# Patient Record
Sex: Female | Born: 1935 | Race: White | Hispanic: No | Marital: Married
Health system: Midwestern US, Academic
[De-identification: ages and names within clinical notes are randomized; demographics above are authoritative.]

---

## 2017-03-01 LAB — BASIC METABOLIC PANEL
Lab: 0.7
Lab: 101
Lab: 13
Lab: 132 — ABNORMAL HIGH (ref 83–110)
Lab: 133 — ABNORMAL LOW (ref 136–145)
Lab: 21 — ABNORMAL LOW (ref 23–31)
Lab: 3.6

## 2017-05-31 ENCOUNTER — Encounter: Admit: 2017-05-31 | Discharge: 2017-05-31 | Payer: MEDICARE

## 2017-05-31 DIAGNOSIS — R079 Chest pain, unspecified: ICD-10-CM

## 2017-05-31 DIAGNOSIS — I498 Other specified cardiac arrhythmias: Principal | ICD-10-CM

## 2017-05-31 DIAGNOSIS — I493 Ventricular premature depolarization: ICD-10-CM

## 2017-06-19 ENCOUNTER — Encounter: Admit: 2017-06-19 | Discharge: 2017-06-19 | Payer: MEDICARE

## 2017-06-19 DIAGNOSIS — M1990 Unspecified osteoarthritis, unspecified site: ICD-10-CM

## 2017-06-19 DIAGNOSIS — IMO0002 Overweight (BMI 25.0-29.9): ICD-10-CM

## 2017-06-19 DIAGNOSIS — N390 Urinary tract infection, site not specified: ICD-10-CM

## 2017-06-19 DIAGNOSIS — R32 Unspecified urinary incontinence: ICD-10-CM

## 2017-06-19 DIAGNOSIS — I10 Essential (primary) hypertension: Principal | ICD-10-CM

## 2017-06-19 DIAGNOSIS — H269 Unspecified cataract: ICD-10-CM

## 2017-06-19 DIAGNOSIS — M797 Fibromyalgia: ICD-10-CM

## 2017-06-19 DIAGNOSIS — R51 Headache: ICD-10-CM

## 2017-06-19 DIAGNOSIS — E785 Hyperlipidemia, unspecified: ICD-10-CM

## 2017-06-19 DIAGNOSIS — K289 Gastrojejunal ulcer, unspecified as acute or chronic, without hemorrhage or perforation: ICD-10-CM

## 2017-06-19 DIAGNOSIS — G459 Transient cerebral ischemic attack, unspecified: ICD-10-CM

## 2017-06-19 DIAGNOSIS — I739 Peripheral vascular disease, unspecified: ICD-10-CM

## 2017-06-19 DIAGNOSIS — R0600 Dyspnea, unspecified: ICD-10-CM

## 2017-06-19 DIAGNOSIS — Z923 Personal history of irradiation: ICD-10-CM

## 2017-06-19 DIAGNOSIS — Z780 Asymptomatic menopausal state: ICD-10-CM

## 2017-06-19 DIAGNOSIS — N200 Calculus of kidney: ICD-10-CM

## 2017-06-19 DIAGNOSIS — K529 Noninfective gastroenteritis and colitis, unspecified: ICD-10-CM

## 2017-07-31 ENCOUNTER — Ambulatory Visit: Admit: 2017-07-31 | Discharge: 2017-08-01 | Payer: MEDICARE

## 2017-07-31 ENCOUNTER — Encounter: Admit: 2017-07-31 | Discharge: 2017-07-31 | Payer: MEDICARE

## 2017-07-31 DIAGNOSIS — G459 Transient cerebral ischemic attack, unspecified: ICD-10-CM

## 2017-07-31 DIAGNOSIS — R32 Unspecified urinary incontinence: ICD-10-CM

## 2017-07-31 DIAGNOSIS — I10 Essential (primary) hypertension: Principal | ICD-10-CM

## 2017-07-31 DIAGNOSIS — R0600 Dyspnea, unspecified: ICD-10-CM

## 2017-07-31 DIAGNOSIS — Z780 Asymptomatic menopausal state: ICD-10-CM

## 2017-07-31 DIAGNOSIS — M797 Fibromyalgia: ICD-10-CM

## 2017-07-31 DIAGNOSIS — M1990 Unspecified osteoarthritis, unspecified site: ICD-10-CM

## 2017-07-31 DIAGNOSIS — K529 Noninfective gastroenteritis and colitis, unspecified: ICD-10-CM

## 2017-07-31 DIAGNOSIS — R5382 Chronic fatigue, unspecified: ICD-10-CM

## 2017-07-31 DIAGNOSIS — N390 Urinary tract infection, site not specified: ICD-10-CM

## 2017-07-31 DIAGNOSIS — E785 Hyperlipidemia, unspecified: ICD-10-CM

## 2017-07-31 DIAGNOSIS — R079 Chest pain, unspecified: Principal | ICD-10-CM

## 2017-07-31 DIAGNOSIS — R0789 Other chest pain: ICD-10-CM

## 2017-07-31 DIAGNOSIS — IMO0002 Overweight (BMI 25.0-29.9): ICD-10-CM

## 2017-07-31 DIAGNOSIS — I159 Secondary hypertension, unspecified: ICD-10-CM

## 2017-07-31 DIAGNOSIS — H269 Unspecified cataract: ICD-10-CM

## 2017-07-31 DIAGNOSIS — Z87891 Personal history of nicotine dependence: ICD-10-CM

## 2017-07-31 DIAGNOSIS — Z923 Personal history of irradiation: ICD-10-CM

## 2017-07-31 DIAGNOSIS — R0602 Shortness of breath: ICD-10-CM

## 2017-07-31 DIAGNOSIS — R51 Headache: ICD-10-CM

## 2017-07-31 DIAGNOSIS — K289 Gastrojejunal ulcer, unspecified as acute or chronic, without hemorrhage or perforation: ICD-10-CM

## 2017-07-31 DIAGNOSIS — Z8673 Personal history of transient ischemic attack (TIA), and cerebral infarction without residual deficits: ICD-10-CM

## 2017-07-31 DIAGNOSIS — I739 Peripheral vascular disease, unspecified: ICD-10-CM

## 2017-07-31 DIAGNOSIS — N200 Calculus of kidney: ICD-10-CM

## 2017-07-31 MED ORDER — DILTIAZEM HCL 120 MG PO CDER
120 mg | ORAL_CAPSULE | Freq: Every day | ORAL | 3 refills | 90.00000 days | Status: AC
Start: 2017-07-31 — End: 2017-09-13

## 2017-07-31 NOTE — Progress Notes
Date of Service: 07/31/2017    Michelle Woods is a 81 y.o. female.       HPI     Patient remotely is a 81 year old white female with a history of hypertension, chronic fatigue, fibromyalgia syndrome, history of TIAs and hyperlipidemia.  She was last seen in the office by me December 2016.    Previously patient was evaluated with an myocardial perfusion imaging study in May 2013, the tomographic pattern was negative for ischemia.  An echocardiogram was also performed on Feb 22, 2012, it demonstrated normal left ventricular systolic function, moderate concentric LVH, there are no significant valvular abnormalities and the pulmonary artery pressure was normal.    Patient is here today and she reports profound shortness of breath that is exacerbated by physical activity and in particular going up stairs.  She has also been experiencing some left-sided chest pain, this occurs with and without physical activity.  Patient has not had any nausea, vomiting, diaphoresis, presyncope or syncope.    She does have a history of tobacco use, patient used to smoke a pack of cigarettes a day.  She quit tobacco products approximately 10 years ago.    She also underwent a right knee replacement in June 2018, she reports that she had she had a difficult time (waking up from anesthesia).    Patient has fibromyalgia and osteoarthritis.  Her medication list does include several neurotropic drugs that are represented by gabapentin, hydrocodone/acetaminophen, hydromorphone, Keppra (apparently she does have a history of seizure disorder), and she also takes methocarbamol.         Vitals:    07/31/17 1032 07/31/17 1034   BP: 148/78 152/82   Pulse:  71   Weight: 73.5 kg (162 lb)    Height: 1.651 m (5' 5)      Body mass index is 26.96 kg/m???.     Past Medical History  Patient Active Problem List    Diagnosis Date Noted   ??? Chronic fatigue 08/31/2015   ??? UTI (lower urinary tract infection) 07/09/2013     - constipation, infrequent estrace use - did not complete PFRT due to distance  - most bothered by nighttime frequency and urine malodor     ??? Atypical chest pain 02/08/2012   ??? Fibromyalgia syndrome 02/08/2012   ??? Hypertension 02/08/2012   ??? Hyperlipidemia LDL goal < 100 02/08/2012   ??? History of TIAs 02/07/2012     1.  No residual           Review of Systems   Constitution: Negative.   HENT: Negative.    Eyes: Negative.    Cardiovascular: Positive for chest pain and dyspnea on exertion.   Respiratory: Negative.    Endocrine: Negative.    Hematologic/Lymphatic: Bruises/bleeds easily.   Skin: Negative.    Musculoskeletal: Positive for arthritis.   Gastrointestinal: Negative.    Genitourinary: Negative.    Neurological: Negative.    Psychiatric/Behavioral: Negative.    Allergic/Immunologic: Negative.        Physical Exam  General Appearance: normal in appearance  Skin: warm, moist, no ulcers or xanthomas  Eyes: conjunctivae and lids normal, pupils are equal and round  Lips & Oral Mucosa: no pallor or cyanosis  Neck Veins: neck veins are flat, neck veins are not distended  Chest Inspection: chest is normal in appearance  Respiratory Effort: breathing comfortably, no respiratory distress  Auscultation/Percussion: lungs clear to auscultation, no rales or rhonchi, no wheezing  Cardiac Rhythm: regular rhythm and normal  rate  Cardiac Auscultation: S1, S2 normal, no rub, no gallop  Murmurs: no murmur  Carotid Arteries: normal carotid upstroke bilaterally, no bruit  Lower Extremity Edema: no lower extremity edema  Abdominal Exam: soft, non-tender, no masses, bowel sounds normal  Liver & Spleen: no organomegaly  Language and Memory: patient responsive and seems to comprehend information  Neurologic Exam: neurological assessment grossly intact      Cardiovascular Studies  Twelve-lead EKG demonstrates normal sinus rhythm, no ST segment T wave changes    Problems Addressed Today  Encounter Diagnoses   Name Primary?   ??? Chest pain, unspecified type Yes ??? Secondary hypertension    ??? Fibromyalgia syndrome    ??? Atypical chest pain    ??? Dyspnea, unspecified type    ??? SOB (shortness of breath)    ??? History of TIAs    ??? Chronic fatigue    ??? History of tobacco abuse        Assessment and Plan     In summary: This is a 81 year old white female with a history of poorly controlled hypertension, profound shortness of breath exacerbated by physical activity (this could be secondary to diastolic dysfunction in the setting of poorly controlled hypertension), left-sided chest pain, history of tobacco use, osteoarthritis and fibromyalgia, patient is on multiple neurotropic medications, chronic fatigue and history of tobacco use.    In the light of the above, I recommend the following:  1.  Discontinue metoprolol  2.  Start diltiazem CD 120 mg p.o. daily (the beta-blocker was changed to a non-dihydropyridine calcium channel blocker in the light of the fact that patient has a history of heavy tobacco use in the past, she could have underlying undiagnosed lung disease and it is possible that the beta-blocker exacerbate her symptoms).  3.  Patient will be further evaluated with a regadenoson MPI and a 2D echo Doppler study  4.  Follow-up office visit in 4-6 weeks to discuss the results of the test and also evaluate her blood pressure.         Current Medications (including today's revisions)  ??? ASCORBATE CALCIUM (VITAMIN C PO) Take 1,000 mg by mouth daily.   ??? aspirin EC 81 mg tablet Take 1 Tab by mouth daily. Take with food.   ??? CALCIUM PO Take 600 mg by mouth daily.   ??? citalopram (CELEXA) 20 mg tablet Take 20 mg by mouth daily.     ??? dicyclomine (BENTYL) 10 mg capsule Take 10 mg by mouth three times daily.   ??? esomeprazole DR(+) (NEXIUM) 40 mg capsule Take 40 mg by mouth daily.   ??? fexofenadine(+) (ALLEGRA) 180 mg tablet Take 180 mg by mouth daily.   ??? gabapentin (NEURONTIN) 300 mg capsule Take 600 mg by mouth three times daily. ??? GLUCOSAMINE/CHONDROITIN SULF A (GLUCOSAMINE-CHONDROITIN PO) Take  by mouth daily.     ??? HYDROcodone/acetaminophen(+) (NORCO) 10/325 mg tablet Take 1 Tab by mouth every 6 hours as needed for Pain   ??? HYDROmorphone(+) (EXALGO ER) 8 mg tablet Take 8 mg by mouth every 24 hours   ??? levETIRAcetam (KEPPRA) 500 mg tablet Take 500 mg by mouth daily.   ??? methocarbamol (ROBAXIN) 500 mg tablet Take 1,000 mg by mouth three times daily.   ??? metoprolol XL (TOPROL XL) 25 mg extended release tablet Take 25 mg by mouth daily.   ??? montelukast (SINGULAIR) 10 mg tablet Take 10 mg by mouth at bedtime daily.   ??? MULTIVIT &MINERALS/FERROUS FUM (MULTI VITAMIN PO)  Take  by mouth daily.     ??? traZODone (DESYREL) 150 mg tablet Take 150 mg by mouth at bedtime daily.     ??? trimethoprim (TRIMPEX) 100 mg tablet Take 100 mg by mouth twice daily.

## 2017-08-03 ENCOUNTER — Encounter: Admit: 2017-08-03 | Discharge: 2017-08-03 | Payer: MEDICARE

## 2017-08-03 NOTE — Telephone Encounter
Pt called and states that she would like to have her stress test rescheduled in [place].  Pt was originally scheduled for echo and stress test at [facility].  Rescheduled her appointments for 11/19 at 9:00 for echo, and 10:30 for stress test.  Pt confirmed appointment times.  Discussed stress test instructions, pt verbalized understanding.  Called and cancelled scheduled testing at [facility].

## 2017-08-24 ENCOUNTER — Ambulatory Visit: Admit: 2017-08-24 | Discharge: 2017-08-25 | Payer: MEDICARE

## 2017-08-24 DIAGNOSIS — R0789 Other chest pain: ICD-10-CM

## 2017-08-24 DIAGNOSIS — R0600 Dyspnea, unspecified: ICD-10-CM

## 2017-08-24 DIAGNOSIS — R079 Chest pain, unspecified: Principal | ICD-10-CM

## 2017-08-24 DIAGNOSIS — M797 Fibromyalgia: ICD-10-CM

## 2017-08-24 DIAGNOSIS — I159 Secondary hypertension, unspecified: ICD-10-CM

## 2017-08-24 MED ORDER — PERFLUTREN LIPID MICROSPHERES 1.1 MG/ML IV SUSP
1-20 mL | Freq: Once | INTRAVENOUS | 0 refills | Status: CP
Start: 2017-08-24 — End: ?

## 2017-08-24 NOTE — Progress Notes
Peripheral IV Insertion Note:  Patient Side: right  Line Orientation:Forearm  IV Catheter Size: 22G  Number of Attempts:1.  IV capped and flushed with Normal Saline.  IV site without redness, swelling, or pain.  New dressing placed.    After procedure IV cannula removed intact and hemostasis achieved.

## 2017-08-27 ENCOUNTER — Encounter: Admit: 2017-08-27 | Discharge: 2017-08-27 | Payer: MEDICARE

## 2017-08-27 ENCOUNTER — Ambulatory Visit: Admit: 2017-08-27 | Discharge: 2017-08-27 | Payer: MEDICARE

## 2017-08-27 DIAGNOSIS — Z923 Personal history of irradiation: ICD-10-CM

## 2017-08-27 DIAGNOSIS — I739 Peripheral vascular disease, unspecified: ICD-10-CM

## 2017-08-27 DIAGNOSIS — M797 Fibromyalgia: ICD-10-CM

## 2017-08-27 DIAGNOSIS — Z780 Asymptomatic menopausal state: ICD-10-CM

## 2017-08-27 DIAGNOSIS — R0600 Dyspnea, unspecified: ICD-10-CM

## 2017-08-27 DIAGNOSIS — F329 Major depressive disorder, single episode, unspecified: ICD-10-CM

## 2017-08-27 DIAGNOSIS — G459 Transient cerebral ischemic attack, unspecified: ICD-10-CM

## 2017-08-27 DIAGNOSIS — K529 Noninfective gastroenteritis and colitis, unspecified: ICD-10-CM

## 2017-08-27 DIAGNOSIS — M1990 Unspecified osteoarthritis, unspecified site: ICD-10-CM

## 2017-08-27 DIAGNOSIS — K289 Gastrojejunal ulcer, unspecified as acute or chronic, without hemorrhage or perforation: ICD-10-CM

## 2017-08-27 DIAGNOSIS — N200 Calculus of kidney: ICD-10-CM

## 2017-08-27 DIAGNOSIS — R32 Unspecified urinary incontinence: ICD-10-CM

## 2017-08-27 DIAGNOSIS — Q019 Encephalocele, unspecified: Principal | ICD-10-CM

## 2017-08-27 DIAGNOSIS — H269 Unspecified cataract: ICD-10-CM

## 2017-08-27 DIAGNOSIS — I10 Essential (primary) hypertension: Principal | ICD-10-CM

## 2017-08-27 DIAGNOSIS — IMO0002 Overweight (BMI 25.0-29.9): ICD-10-CM

## 2017-08-27 DIAGNOSIS — R51 Headache: ICD-10-CM

## 2017-08-27 DIAGNOSIS — E785 Hyperlipidemia, unspecified: ICD-10-CM

## 2017-08-27 DIAGNOSIS — N390 Urinary tract infection, site not specified: ICD-10-CM

## 2017-08-27 NOTE — Telephone Encounter
Called and discussed results with patient.  No questions at this time.  Pt will callback with any questions, concerns or problems.

## 2017-08-27 NOTE — Telephone Encounter
-----   Message from Samantha Crimes, MD sent at 08/26/2017  5:59 PM CST -----  Dear nurses,    Please let this patient know that her echocardiogram is overall unremarkable.    Thank you      ----- Message -----  From: Shan Levans, MD  Sent: 08/24/2017   1:40 PM  To: Samantha Crimes, MD

## 2017-08-27 NOTE — Progress Notes
Date of Service: 08/27/2017    Subjective:             Michelle Woods is a 81 y.o. female.    History of Present Illness  PCP: Dr. Ozzie Hoyle  ENT - Dr. 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  She has had headaches for a long period of time.  She describes the headaches as occurring about 2-3 times a week.  She describes them at various locations in her head.  It gets better with rest and sleeping as well as Aleve.  There is nothing that significantly causes the headaches.  The frequency does vary.  She denies any neurologic symptoms associated with it.  She denies any clear drainage from her nose or salty taste in the back of her throat.  She denies any otorrhea.    The patient does have a history of what sounds like simple partial seizures.  The patient follows with Dr. Onalee Hua Ewing for this.  She has been put on the sonogram and Keppra for about 3-4 years.  She has not had any seizures since being put on these medications.    The patient has a history of cerebral AVM.  She underwent stereotactic radiosurgery by Dr. Shearon Stalls in the 90s.  She tells me that it has been obliterated by this.  I do not have any imaging to confirm this.     Review of Systems   Eyes: Positive for visual disturbance.   Respiratory: Positive for shortness of breath.    Gastrointestinal:        Heartburn     Genitourinary: Positive for frequency.        Leaking urine, nocturia     Musculoskeletal: Positive for back pain, myalgias and neck pain.   Allergic/Immunologic:        Frequent infections     Neurological: Positive for headaches.   Psychiatric/Behavioral: The patient is nervous/anxious.    All other systems reviewed and are negative.    Past Medical History:   Diagnosis Date   ??? Arthritis    ??? Cataract    ??? Chest pain    ??? Claudication [facility])    ??? Colitis    ??? Depression    ??? Dyspnea    ??? Fibromyalgia ??? Generalized headaches    ??? History of radiation therapy    ??? HTN (hypertension)    ??? Hyperlipemia    ??? Incontinence    ??? Kidney stones    ??? Overweight (BMI 25.0-29.9)    ??? Postmenopausal    ??? TIA (transient ischemic attack)    ??? Ulcer of the stomach and intestine    ??? Urinary tract infection      Past Surgical History:   Procedure Laterality Date   ??? 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 CHOLECYSTECTOMY     ??? ELECTROCARDIOGRAM     ??? HX BACK SURGERY     ??? HYSTERECTOMY     ??? KIDNEY STONE SURGERY     ??? MYOCARDIAL PERFUSION IMG STUDY       Family History   Problem Relation Age of Onset   ??? Stroke Mother      Social History     Social History   ??? Marital status: Married     Spouse name: N/A   ???  Number of children: N/A   ??? Years of education: N/A     Social History Main Topics   ??? Smoking status: Former Smoker     Packs/day: 1.00     Years: 50.00     Types: Cigarettes   ??? Smokeless tobacco: Never Used   ??? Alcohol use No   ??? Drug use: No   ??? Sexual activity: No     Other Topics Concern   ??? Not on file     Social History Narrative   ??? No narrative on file       Objective:         ??? ASCORBATE CALCIUM (VITAMIN C PO) Take 1,000 mg by mouth daily.   ??? aspirin EC 81 mg tablet Take 1 Tab by mouth daily. Take with food.   ??? CALCIUM PO Take 600 mg by mouth daily.   ??? cetirizine (ZYRTEC) 10 mg tablet Take 10 mg by mouth every morning.   ??? citalopram (CELEXA) 20 mg tablet Take 20 mg by mouth daily.     ??? dicyclomine (BENTYL) 10 mg capsule Take 10 mg by mouth three times daily.   ??? diltiazem XR (DILACOR XR) 120 mg capsule Take one capsule by mouth daily.   ??? esomeprazole DR(+) (NEXIUM) 40 mg capsule Take 40 mg by mouth daily.   ??? gabapentin (NEURONTIN) 300 mg capsule Take 600 mg by mouth three times daily.     ??? GLUCOSAMINE/CHONDROITIN SULF A (GLUCOSAMINE-CHONDROITIN PO) Take  by mouth daily.     ??? HYDROcodone/acetaminophen(+) (NORCO) 10/325 mg tablet Take 1 Tab by mouth every 6 hours as needed for Pain   ??? HYDROmorphone(+) (EXALGO ER) 8 mg tablet Take 8 mg by mouth every 24 hours   ??? levETIRAcetam (KEPPRA) 500 mg tablet Take 500 mg by mouth daily.   ??? methocarbamol (ROBAXIN) 500 mg tablet Take 1,000 mg by mouth three times daily.   ??? metoprolol XL (TOPROL XL) 25 mg extended release tablet Take 25 mg by mouth daily.   ??? mometasone furoate (NASONEX NA) Apply 1 spray into nose as directed daily as needed.   ??? montelukast (SINGULAIR) 10 mg tablet Take 10 mg by mouth at bedtime daily.   ??? MULTIVIT &MINERALS/FERROUS FUM (MULTI VITAMIN PO) Take  by mouth daily.     ??? senna/docusate (SENOKOT-S) 8.6/50 mg tablet Take 1 tablet by mouth twice daily.   ??? traMADol (ULTRAM) 50 mg tablet Take 50 mg by mouth every 6 hours as needed for Pain.   ??? traZODone (DESYREL) 150 mg tablet Take 150 mg by mouth at bedtime daily.     ??? trimethoprim (TRIMPEX) 100 mg tablet Take 100 mg by mouth twice daily.   ??? zonisamide (ZONEGRAN) 100 mg capsule Take 100 mg by mouth at bedtime daily.     Vitals:    08/27/17 1213   BP: 136/71   Pulse: 80   Weight: 72.8 kg (160 lb 9.6 oz)   Height: 165.1 cm (65)     Body mass index is 26.73 kg/m???.     Physical Exam  Constitutional: Well-developed, well nourished  HENT: Normocephalic, atraumatic, Oropharynx clear and moist  Eyes: Pupils equal round and reactive to light, Extraocular muscles normal  Cardiovascular: Heart sounds normal  Pulmonary: Effort normal  Abdominal: No tenderness  Musculoskeletal: ROM normal  Skin: Warm, dry  Neurological: A&Ox3, speech normal  CN: Facial sensation equal, Smile equal, eye closure equal, hearing present bilaterally, palate rises  bilaterally, shoulder shrug equal,  tongue protrusion midline  5/5 strength x 4 extremities  Soft touch present x 4 extremities    MRI of the brain reviewed which shows evidence of a left suboccipital bony mass.  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 CT head reviewed which shows the bony lesion as well as the dehiscence in the middle cranial fossa going into the sphenoid sinus.    I have compared her MRI that was done recently to an old one on the [facility] PACS system from 2012.  Both of these lesions were evident at that time.     Assessment and Plan:  81 year old female who presents with headaches.  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           I have explained to the patient and her daughter that these lesions that we have seen on her imaging have been present since at least 2012.  I do not believe that these are causing her headaches.  She does not have any signs and symptoms to suggest spinal fluid leak.  At this time, I do not think that we need to do anything aggressive from a neurosurgical standpoint.  We discussed the possibility of watching this with serial imaging, potentially in 5 years.  The patient made it clear to me that she really does not want any thing done.  Therefore, she has declined follow-up imaging, which I think is reasonable at her age of 70.  If she develops any signs and symptoms of CSF leak, or uncontrolled seizures, or other neurologic symptoms, she will let us know and we can certainly address it at that time.    Happy to see her back on an as-needed basis.    Thank you for allowing me to take care of Michelle Woods. If you have any questions or concerns, do not hesitate to contact me at [phone].     Theotis Barrio MD

## 2017-08-29 ENCOUNTER — Ambulatory Visit: Admit: 2017-08-29 | Discharge: 2017-08-30 | Payer: MEDICARE

## 2017-08-29 MED ORDER — NITROGLYCERIN 0.4 MG SL SUBL
.4 mg | SUBLINGUAL | 0 refills | Status: AC | PRN
Start: 2017-08-29 — End: ?

## 2017-08-29 MED ORDER — ALBUTEROL SULFATE 90 MCG/ACTUATION IN HFAA
2 | RESPIRATORY_TRACT | 0 refills | Status: DC | PRN
Start: 2017-08-29 — End: 2017-09-03

## 2017-08-29 MED ORDER — REGADENOSON 0.4 MG/5 ML IV SYRG
.4 mg | Freq: Once | INTRAVENOUS | 0 refills | Status: CP
Start: 2017-08-29 — End: ?

## 2017-08-29 MED ORDER — SODIUM CHLORIDE 0.9 % IV SOLP
250 mL | INTRAVENOUS | 0 refills | Status: AC | PRN
Start: 2017-08-29 — End: ?

## 2017-08-29 MED ORDER — AMINOPHYLLINE 500 MG/20 ML IV SOLN
50 mg | INTRAVENOUS | 0 refills | Status: AC | PRN
Start: 2017-08-29 — End: ?

## 2017-08-29 NOTE — Progress Notes
Peripheral IV Insertion Note:  Patient Side: left  Line Orientation:Forearm  IV Catheter Size: 22G  Number of Attempts:1.  IV capped and flushed with Normal Saline.  IV site without redness, swelling, or pain.  New dressing placed.    After procedure IV cannula removed intact and hemostasis achieved.

## 2017-09-03 ENCOUNTER — Encounter: Admit: 2017-09-03 | Discharge: 2017-09-03 | Payer: MEDICARE

## 2017-09-13 ENCOUNTER — Encounter: Admit: 2017-09-13 | Discharge: 2017-09-13 | Payer: MEDICARE

## 2017-09-13 ENCOUNTER — Ambulatory Visit: Admit: 2017-09-13 | Discharge: 2017-09-14 | Payer: MEDICARE

## 2017-09-13 DIAGNOSIS — N200 Calculus of kidney: ICD-10-CM

## 2017-09-13 DIAGNOSIS — M797 Fibromyalgia: ICD-10-CM

## 2017-09-13 DIAGNOSIS — N390 Urinary tract infection, site not specified: ICD-10-CM

## 2017-09-13 DIAGNOSIS — R0602 Shortness of breath: ICD-10-CM

## 2017-09-13 DIAGNOSIS — Z8673 Personal history of transient ischemic attack (TIA), and cerebral infarction without residual deficits: ICD-10-CM

## 2017-09-13 DIAGNOSIS — E781 Pure hyperglyceridemia: ICD-10-CM

## 2017-09-13 DIAGNOSIS — K529 Noninfective gastroenteritis and colitis, unspecified: ICD-10-CM

## 2017-09-13 DIAGNOSIS — M1990 Unspecified osteoarthritis, unspecified site: ICD-10-CM

## 2017-09-13 DIAGNOSIS — H269 Unspecified cataract: ICD-10-CM

## 2017-09-13 DIAGNOSIS — R51 Headache: ICD-10-CM

## 2017-09-13 DIAGNOSIS — Z923 Personal history of irradiation: ICD-10-CM

## 2017-09-13 DIAGNOSIS — R0989 Other specified symptoms and signs involving the circulatory and respiratory systems: Principal | ICD-10-CM

## 2017-09-13 DIAGNOSIS — R0789 Other chest pain: ICD-10-CM

## 2017-09-13 DIAGNOSIS — E785 Hyperlipidemia, unspecified: ICD-10-CM

## 2017-09-13 DIAGNOSIS — K289 Gastrojejunal ulcer, unspecified as acute or chronic, without hemorrhage or perforation: ICD-10-CM

## 2017-09-13 DIAGNOSIS — R0600 Dyspnea, unspecified: ICD-10-CM

## 2017-09-13 DIAGNOSIS — IMO0002 Overweight (BMI 25.0-29.9): ICD-10-CM

## 2017-09-13 DIAGNOSIS — I739 Peripheral vascular disease, unspecified: ICD-10-CM

## 2017-09-13 DIAGNOSIS — R32 Unspecified urinary incontinence: ICD-10-CM

## 2017-09-13 DIAGNOSIS — Z87891 Personal history of nicotine dependence: ICD-10-CM

## 2017-09-13 DIAGNOSIS — G459 Transient cerebral ischemic attack, unspecified: ICD-10-CM

## 2017-09-13 DIAGNOSIS — I10 Essential (primary) hypertension: Principal | ICD-10-CM

## 2017-09-13 DIAGNOSIS — F329 Major depressive disorder, single episode, unspecified: ICD-10-CM

## 2017-09-13 DIAGNOSIS — Z780 Asymptomatic menopausal state: ICD-10-CM

## 2017-09-13 MED ORDER — LISINOPRIL 10 MG PO TAB
10 mg | ORAL_TABLET | Freq: Two times a day (BID) | ORAL | 4 refills | Status: AC
Start: 2017-09-13 — End: 2018-05-09

## 2017-09-13 NOTE — Progress Notes
Carotid duplex scheduled for Jan 2 at 9:30 am at the [facility].  Order faxed and pt aware of date, time and location.  Flag forwarded to watch for results.

## 2017-09-13 NOTE — Progress Notes
Date of Service: 09/13/2017    Michelle Woods is a 81 y.o. female.       HPI     Mrs. Michelle Woods is a 81 year old white female, she was seen by me in the office on 07/31/2017.  She presented with symptoms of shortness of breath, poorly controlled hypertension, left-sided chest pain.  This patient also has a history of osteoarthritis and fibromyalgia.    She was evaluated with a 2D echo Doppler study on 08/24/2017, the left ventricular systolic function was normal, she was found to have mild concentric LVH, there are no significant valvular appendectomy mitis and the pulmonary artery pressure was normal.  In addition, a nuclear stress test dated 08/29/2017 did not demonstrate ischemia and ejection fraction was normal at 64%.    At the previous office visit, I intended to discontinue the metoprolol and I prescribed diltiazem.  The rationale for this change was patient's previous history of tobacco use and a concern that a beta-blocker could enhance the symptoms of dyspnea.  However, for reasons that are not completely clear and patient continues to take this changes never took place the metoprolol.    Today in the office the blood pressure was 166/84 mmHg in the left arm and 176/86 mmHg in the right arm with a heart rate of 68 bpm.    Also, a right carotid bruit was heard today on the physical exam.         Vitals:    09/13/17 1449 09/13/17 1456   BP: 166/84 176/86   Pulse: 68    Weight: 73.4 kg (161 lb 12.8 oz)    Height: 1.651 m (5' 5)      Body mass index is 26.92 kg/m???.     Past Medical History  Patient Active Problem List    Diagnosis Date Noted   ??? 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 SOB (shortness of breath) 07/31/2017   ??? History of tobacco abuse 07/31/2017   ??? Chronic fatigue 08/31/2015   ??? UTI (lower urinary tract infection) 07/09/2013     - constipation, infrequent estrace use  - did not complete PFRT due to distance  - most bothered by nighttime frequency and urine malodor ??? Atypical chest pain 02/08/2012   ??? Fibromyalgia syndrome 02/08/2012   ??? Essential hypertension 02/08/2012   ??? Hyperlipidemia LDL goal < 100 02/08/2012   ??? History of TIAs 02/07/2012     1.  No residual           Review of Systems   Constitution: Negative.   HENT: Negative.    Eyes: Negative.    Cardiovascular: Positive for chest pain and dyspnea on exertion.   Respiratory: Negative.    Endocrine: Negative.    Hematologic/Lymphatic: Negative.    Skin: Negative.    Musculoskeletal: Positive for arthritis and back pain.   Gastrointestinal: Negative.    Genitourinary: Negative.    Neurological: Negative.    Psychiatric/Behavioral: Negative.    Allergic/Immunologic: Negative.        Physical Exam  General Appearance: normal in appearance  Skin: warm, moist, no ulcers or xanthomas  Eyes: conjunctivae and lids normal, pupils are equal and round  Lips & Oral Mucosa: no pallor or cyanosis  Neck Veins: neck veins are flat, neck veins are not distended  Chest Inspection: chest is normal in appearance  Respiratory Effort: breathing comfortably, no respiratory distress  Auscultation/Percussion: lungs clear to auscultation, no rales or rhonchi, no wheezing  Cardiac Rhythm: regular rhythm and  normal rate  Cardiac Auscultation: S1, S2 normal, no rub, no gallop  Murmurs: no murmur  Carotid Arteries: normal carotid upstroke bilaterally, R bruit  Lower Extremity Edema: no lower extremity edema  Abdominal Exam: soft, non-tender, no masses, bowel sounds normal  Liver & Spleen: no organomegaly  Language and Memory: patient responsive and seems to comprehend information  Neurologic Exam: neurological assessment grossly intact      Cardiovascular Studies      Problems Addressed Today  Encounter Diagnoses   Name Primary?   ??? Bruit Yes   ??? Essential hypertension    ??? History of TIAs    ??? Pure hyperglyceridemia    ??? Atypical chest pain    ??? Fibromyalgia syndrome    ??? SOB (shortness of breath)    ??? History of tobacco abuse ??? Right carotid bruit        Assessment and Plan     In summary: This is a 81 year old white female with poorly controlled hypertension, unremarkable recent workup (this consisted of a 2D echo Doppler study and myocardial perfusion imaging study), a newly her right carotid bruit today on the physical exam.  Next    In the light of the above, I recommend the following    1. I did discontinue the diltiazem that was prescribed at the last office visit and was serial present on patient's medication list  2. I asked her to continue take metoprolol  3. Initiate lisinopril 10 mg p.o. twice daily (patient was on this medication before and according to her report she was instructed to discontinue it).  4. Further evaluation with a carotid artery duplex???we will follow-up on the results of this test and we will call with further recommendations.             Current Medications (including today's revisions)  ??? ASCORBATE CALCIUM (VITAMIN C PO) Take 1,000 mg by mouth daily.   ??? aspirin EC 81 mg tablet Take 1 Tab by mouth daily. Take with food.   ??? CALCIUM PO Take 600 mg by mouth daily.   ??? cetirizine (ZYRTEC) 10 mg tablet Take 10 mg by mouth every morning.   ??? citalopram (CELEXA) 20 mg tablet Take 20 mg by mouth daily.     ??? clarithromycin (BIAXIN) 500 mg tablet Take 1 tablet by mouth twice daily.   ??? dicyclomine (BENTYL) 10 mg capsule Take 10 mg by mouth three times daily.   ??? diltiazem XR (DILACOR XR) 120 mg capsule Take one capsule by mouth daily.   ??? esomeprazole DR(+) (NEXIUM) 40 mg capsule Take 40 mg by mouth daily.   ??? gabapentin (NEURONTIN) 300 mg capsule Take 600 mg by mouth three times daily.     ??? GLUCOSAMINE/CHONDROITIN SULF A (GLUCOSAMINE-CHONDROITIN PO) Take  by mouth daily.     ??? HYDROcodone/acetaminophen(+) (NORCO) 10/325 mg tablet Take 1 Tab by mouth every 6 hours as needed for Pain   ??? HYDROmorphone(+) (EXALGO ER) 8 mg tablet Take 8 mg by mouth every 24 hours ??? levETIRAcetam (KEPPRA) 500 mg tablet Take 500 mg by mouth daily.   ??? methocarbamol (ROBAXIN) 500 mg tablet Take 1,000 mg by mouth three times daily.   ??? metoprolol XL (TOPROL XL) 25 mg extended release tablet Take 25 mg by mouth daily.   ??? mometasone furoate (NASONEX NA) Apply 1 spray into nose as directed daily as needed.   ??? montelukast (SINGULAIR) 10 mg tablet Take 10 mg by mouth at bedtime daily.   ???  MULTIVIT &MINERALS/FERROUS FUM (MULTI VITAMIN PO) Take  by mouth daily.     ??? senna/docusate (SENOKOT-S) 8.6/50 mg tablet Take 1 tablet by mouth twice daily.   ??? traMADol (ULTRAM) 50 mg tablet Take 50 mg by mouth every 6 hours as needed for Pain.   ??? traZODone (DESYREL) 150 mg tablet Take 150 mg by mouth at bedtime daily.     ??? trimethoprim (TRIMPEX) 100 mg tablet Take 100 mg by mouth twice daily.   ??? zonisamide (ZONEGRAN) 100 mg capsule Take 100 mg by mouth at bedtime daily.

## 2017-09-27 ENCOUNTER — Encounter: Admit: 2017-09-27 | Discharge: 2017-09-27 | Payer: MEDICARE

## 2017-10-01 ENCOUNTER — Encounter: Admit: 2017-10-01 | Discharge: 2017-10-01 | Payer: MEDICARE

## 2017-10-01 DIAGNOSIS — E781 Pure hyperglyceridemia: Principal | ICD-10-CM

## 2017-10-01 DIAGNOSIS — I10 Essential (primary) hypertension: ICD-10-CM

## 2017-10-03 ENCOUNTER — Encounter: Admit: 2017-10-03 | Discharge: 2017-10-03 | Payer: MEDICARE

## 2017-11-07 LAB — LIVER FUNCTION PANEL
Lab: 0.1
Lab: 0.3
Lab: 29
Lab: 3.6
Lab: 30
Lab: 7.3
Lab: 88

## 2017-11-08 ENCOUNTER — Encounter: Admit: 2017-11-08 | Discharge: 2017-11-08 | Payer: MEDICARE

## 2017-11-08 DIAGNOSIS — I10 Essential (primary) hypertension: ICD-10-CM

## 2017-11-08 DIAGNOSIS — E781 Pure hyperglyceridemia: Principal | ICD-10-CM

## 2018-05-09 ENCOUNTER — Ambulatory Visit: Admit: 2018-05-09 | Discharge: 2018-05-10 | Payer: MEDICARE

## 2018-05-09 ENCOUNTER — Encounter: Admit: 2018-05-09 | Discharge: 2018-05-09 | Payer: MEDICARE

## 2018-05-09 DIAGNOSIS — E785 Hyperlipidemia, unspecified: ICD-10-CM

## 2018-05-09 DIAGNOSIS — G459 Transient cerebral ischemic attack, unspecified: ICD-10-CM

## 2018-05-09 DIAGNOSIS — I10 Essential (primary) hypertension: Principal | ICD-10-CM

## 2018-05-09 DIAGNOSIS — IMO0002 Overweight (BMI 25.0-29.9): ICD-10-CM

## 2018-05-09 DIAGNOSIS — Z923 Personal history of irradiation: ICD-10-CM

## 2018-05-09 DIAGNOSIS — M1990 Unspecified osteoarthritis, unspecified site: ICD-10-CM

## 2018-05-09 DIAGNOSIS — H269 Unspecified cataract: ICD-10-CM

## 2018-05-09 DIAGNOSIS — E781 Pure hyperglyceridemia: ICD-10-CM

## 2018-05-09 DIAGNOSIS — N200 Calculus of kidney: ICD-10-CM

## 2018-05-09 DIAGNOSIS — M797 Fibromyalgia: ICD-10-CM

## 2018-05-09 DIAGNOSIS — K289 Gastrojejunal ulcer, unspecified as acute or chronic, without hemorrhage or perforation: ICD-10-CM

## 2018-05-09 DIAGNOSIS — Z780 Asymptomatic menopausal state: ICD-10-CM

## 2018-05-09 DIAGNOSIS — F329 Major depressive disorder, single episode, unspecified: ICD-10-CM

## 2018-05-09 DIAGNOSIS — R0600 Dyspnea, unspecified: ICD-10-CM

## 2018-05-09 DIAGNOSIS — R0602 Shortness of breath: ICD-10-CM

## 2018-05-09 DIAGNOSIS — R51 Headache: ICD-10-CM

## 2018-05-09 DIAGNOSIS — R5382 Chronic fatigue, unspecified: ICD-10-CM

## 2018-05-09 DIAGNOSIS — R0989 Other specified symptoms and signs involving the circulatory and respiratory systems: ICD-10-CM

## 2018-05-09 DIAGNOSIS — K529 Noninfective gastroenteritis and colitis, unspecified: ICD-10-CM

## 2018-05-09 DIAGNOSIS — I739 Peripheral vascular disease, unspecified: ICD-10-CM

## 2018-05-09 DIAGNOSIS — N390 Urinary tract infection, site not specified: ICD-10-CM

## 2018-05-09 DIAGNOSIS — R32 Unspecified urinary incontinence: ICD-10-CM

## 2018-05-09 DIAGNOSIS — Q019 Encephalocele, unspecified: ICD-10-CM

## 2018-05-09 MED ORDER — LISINOPRIL 10 MG PO TAB
10 mg | ORAL_TABLET | Freq: Every evening | ORAL | 3 refills | Status: AC
Start: 2018-05-09 — End: 2018-06-10

## 2018-05-09 MED ORDER — METOPROLOL SUCCINATE 25 MG PO TB24
25 mg | ORAL_TABLET | Freq: Every day | ORAL | 3 refills | 90.00000 days | Status: AC
Start: 2018-05-09 — End: ?

## 2018-06-10 ENCOUNTER — Encounter: Admit: 2018-06-10 | Discharge: 2018-06-10 | Payer: MEDICARE

## 2018-06-10 MED ORDER — LISINOPRIL 10 MG PO TAB
10 mg | ORAL_TABLET | Freq: Every evening | ORAL | 0 refills | Status: AC
Start: 2018-06-10 — End: ?

## 2018-11-04 ENCOUNTER — Encounter: Admit: 2018-11-04 | Discharge: 2018-11-04 | Payer: MEDICARE

## 2018-11-04 ENCOUNTER — Encounter: Admit: 2018-11-04 | Discharge: 2018-11-05 | Payer: MEDICARE

## 2018-11-08 ENCOUNTER — Encounter: Admit: 2018-11-08 | Discharge: 2018-11-08 | Payer: MEDICARE

## 2018-11-08 ENCOUNTER — Ambulatory Visit: Admit: 2018-11-08 | Discharge: 2018-11-08 | Payer: MEDICARE

## 2018-11-08 ENCOUNTER — Encounter: Admit: 2018-11-08 | Discharge: 2018-11-09 | Payer: MEDICARE

## 2018-11-14 ENCOUNTER — Encounter: Admit: 2018-11-14 | Discharge: 2018-11-14 | Payer: MEDICARE

## 2018-11-14 DIAGNOSIS — C50911 Malignant neoplasm of unspecified site of right female breast: Principal | ICD-10-CM

## 2018-11-20 ENCOUNTER — Encounter: Admit: 2018-11-20 | Discharge: 2018-11-20 | Payer: MEDICARE

## 2018-11-21 ENCOUNTER — Encounter: Admit: 2018-11-21 | Discharge: 2018-11-21 | Payer: MEDICARE

## 2018-11-22 ENCOUNTER — Encounter: Admit: 2018-11-22 | Discharge: 2018-11-22 | Payer: MEDICARE

## 2018-11-22 DIAGNOSIS — C50911 Malignant neoplasm of unspecified site of right female breast: Principal | ICD-10-CM

## 2018-11-25 NOTE — Progress Notes
??? senna/docusate (SENOKOT-S) 8.6/50 mg tablet Take 1 tablet by mouth twice daily.   ??? traMADol (ULTRAM) 50 mg tablet Take 50 mg by mouth every 6 hours as needed for Pain.   ??? traZODone (DESYREL) 150 mg tablet Take 150 mg by mouth at bedtime daily.     ??? trimethoprim (TRIMPEX) 100 mg tablet Take 100 mg by mouth twice daily.   ??? zonisamide (ZONEGRAN) 100 mg capsule Take 100 mg by mouth at bedtime daily.     Vitals:    11/28/18 1342 11/28/18 1344   BP: (!) 202/108    BP Source: Arm, Left Upper    Patient Position: Sitting    Pulse: 80    Resp: 18    Temp: 36.6 ???C (97.8 ???F)    TempSrc: Oral    SpO2: 100%    Weight: 71.2 kg (157 lb)    Height: 163.2 cm (64.25)    PainSc:  Eight     Body mass index is 26.74 kg/m???.     Pain Score: Eight  Pain Loc: Head    Fatigue Scale: 0-None    Pain Addressed:  Patient to call office if pain not relieved or worsened and Norvasc given to lower blood pressure    Patient Evaluated for a Clinical Trial: No treatment clinical trial available for this patient.     [place] Cooperative Oncology Group performance status is 1, Restricted in physically strenuous activity but ambulatory and able to carry out work of a light or sedentary nature, e.g., light house work, office work.     Physical Exam  Vitals signs reviewed.   Constitutional:       General: She is not in acute distress.     Appearance: Normal appearance.   Cardiovascular:      Rate and Rhythm: Normal rate.      Heart sounds: No murmur.   Pulmonary:      Effort: Pulmonary effort is normal. No respiratory distress.      Breath sounds: Normal breath sounds. No wheezing.   Chest:       Abdominal:      General: Abdomen is flat. There is no distension.   Musculoskeletal: Normal range of motion.         General: No tenderness.   Lymphadenopathy:      Cervical: No cervical adenopathy.      Upper Body:      Right upper body: Axillary adenopathy present.   Skin:     General: Skin is warm.   Neurological: 2.  Local therapy: Established care with Dr. Cyril Mourning and she will decide if she wants to pursue chemotherapy or go to surgery first.     3.  Patient does not meet guidelines for genetic testing.     4. Will request unstained slides of 12:00 lesion and run prognostic markers.     5.  HTN: Takes lisinopril and metoprolol.  She was given norvasc 5mg  today in clinic, but left prior to having bp re-checked. She was called and left a message to stop at [facility] to have bp repeated.  Encourage her to follow up with her cardiologist.  Will get an echo now.     6.  40 pack smoking history.  Staging scans done in [place] 11/26/18 (CT cap, bone scan) shows no evidence of metastatic disease.  Results reviewed with Jennefer and her family.     7.  A port will be recommended if she decides to take chemotherapy.  RTC based on chemotherapy decision.  If opts out of chemotherapy follow up 2 weeks post operatively.      Brooke Pace [place], APRN-NP                    I personally interviewed and examined the patient. I have reviewed the history, physical, impression and plan outlined by the nurse practitioner.     The patient presents with Stage 2 breast cancer.     On examination there is:   This is a 83 y.o. female in no acute distress, well developed.   HEENT:  No icterus,   Neck: No JVD, supple.   Adenopathy:  None.  Chest: CTA bilaterally.   CV:  RRR without murmur.   Breasts:  As above.   Abdomen: Soft, non-distended, non-tender, positive bowel sounds, no organomegaly.   Skin: No rash.   Back:  No tenderness with palpation or percussion over the spine.   Extremities: No clubbing, cyanosis or edema.   Neuro:  A&O x3    My impression is:   T2N+ TNBC    My plan is:   Discussed pros and cons of neo vs adjuvant chemotherapy and risks given her age.   Patient to decide if she will proceed with surgery or decide on taking chemotherapy first    Osborn Coho, MD

## 2018-11-26 ENCOUNTER — Encounter: Admit: 2018-11-26 | Discharge: 2018-11-26 | Payer: MEDICARE

## 2018-11-28 ENCOUNTER — Encounter: Admit: 2018-11-28 | Discharge: 2018-11-28 | Payer: MEDICARE

## 2018-11-28 ENCOUNTER — Encounter: Admit: 2018-11-28 | Discharge: 2018-11-29 | Payer: MEDICARE

## 2018-11-28 DIAGNOSIS — C50919 Malignant neoplasm of unspecified site of unspecified female breast: ICD-10-CM

## 2018-11-28 DIAGNOSIS — Z79899 Other long term (current) drug therapy: ICD-10-CM

## 2018-11-28 DIAGNOSIS — R32 Unspecified urinary incontinence: ICD-10-CM

## 2018-11-28 DIAGNOSIS — G40909 Epilepsy, unspecified, not intractable, without status epilepticus: ICD-10-CM

## 2018-11-28 DIAGNOSIS — C50911 Malignant neoplasm of unspecified site of right female breast: ICD-10-CM

## 2018-11-28 DIAGNOSIS — C773 Secondary and unspecified malignant neoplasm of axilla and upper limb lymph nodes: ICD-10-CM

## 2018-11-28 DIAGNOSIS — Z170 Estrogen receptor positive status [ER+]: ICD-10-CM

## 2018-11-28 DIAGNOSIS — M1990 Unspecified osteoarthritis, unspecified site: ICD-10-CM

## 2018-11-28 DIAGNOSIS — Z9189 Other specified personal risk factors, not elsewhere classified: ICD-10-CM

## 2018-11-28 DIAGNOSIS — Z5181 Encounter for therapeutic drug level monitoring: ICD-10-CM

## 2018-11-28 DIAGNOSIS — C50411 Malignant neoplasm of upper-outer quadrant of right female breast: ICD-10-CM

## 2018-11-28 DIAGNOSIS — R0600 Dyspnea, unspecified: ICD-10-CM

## 2018-11-28 DIAGNOSIS — I10 Essential (primary) hypertension: Principal | ICD-10-CM

## 2018-11-28 DIAGNOSIS — F329 Major depressive disorder, single episode, unspecified: ICD-10-CM

## 2018-11-28 DIAGNOSIS — C50811 Malignant neoplasm of overlapping sites of right female breast: Principal | ICD-10-CM

## 2018-11-28 DIAGNOSIS — H269 Unspecified cataract: ICD-10-CM

## 2018-11-28 DIAGNOSIS — IMO0002 Overweight (BMI 25.0-29.9): ICD-10-CM

## 2018-11-28 DIAGNOSIS — Z171 Estrogen receptor negative status [ER-]: ICD-10-CM

## 2018-11-28 DIAGNOSIS — Z780 Asymptomatic menopausal state: ICD-10-CM

## 2018-11-28 DIAGNOSIS — K529 Noninfective gastroenteritis and colitis, unspecified: ICD-10-CM

## 2018-11-28 DIAGNOSIS — E785 Hyperlipidemia, unspecified: ICD-10-CM

## 2018-11-28 DIAGNOSIS — K319 Disease of stomach and duodenum, unspecified: ICD-10-CM

## 2018-11-28 DIAGNOSIS — R51 Headache: ICD-10-CM

## 2018-11-28 DIAGNOSIS — N200 Calculus of kidney: ICD-10-CM

## 2018-11-28 DIAGNOSIS — M797 Fibromyalgia: ICD-10-CM

## 2018-11-28 LAB — COMPREHENSIVE METABOLIC PANEL
Lab: 14 U/L (ref 7–56)
Lab: 31 MMOL/L — ABNORMAL HIGH (ref 21–30)
Lab: 56 mL/min — ABNORMAL LOW (ref >60)

## 2018-11-28 LAB — CBC AND DIFF: Lab: 0.1 10*3/uL (ref 0–0.20)

## 2018-11-28 MED ORDER — AMLODIPINE 5 MG PO TAB
5 mg | Freq: Once | ORAL | 0 refills | Status: AC
Start: 2018-11-28 — End: ?

## 2018-11-28 NOTE — Progress Notes
stiffness. Negative for gait problem, joint swelling and neck pain.   Skin: Negative.    Allergic/Immunologic: Negative.    Neurological: Positive for tremors, seizures and headaches. Negative for dizziness, syncope, facial asymmetry, speech difficulty, weakness, light-headedness and numbness.   Hematological: Negative for adenopathy. Bruises/bleeds easily.   Psychiatric/Behavioral: Positive for agitation, confusion and decreased concentration. Negative for behavioral problems, dysphoric mood, hallucinations, self-injury, sleep disturbance and suicidal ideas. The patient is nervous/anxious. The patient is not hyperactive.      Allergies   Allergen Reactions   ??? Demerol [Meperidine] MENTAL STATUS CHANGES   ??? Oxycodone MENTAL STATUS CHANGES   ??? Prednisone MENTAL STATUS CHANGES   ??? Rocephin [Ceftriaxone] RASH and EDEMA   ??? Acetaminophen HEADACHE   ??? Ibuprofen STOMACH UPSET     The following medical/surgical/family/social history and the list of medications are current, as of 11/28/2018    Medical History:   Diagnosis Date   ??? Arthritis    ??? Breast cancer (HCC) 10/2018   ??? Cataract    ??? Colitis    ??? Depression    ??? Dyspnea    ??? Fibromyalgia    ??? Generalized headaches    ??? HTN (hypertension)    ??? Hyperlipemia    ??? Incontinence    ??? Kidney stones    ??? Overweight (BMI 25.0-29.9)    ??? Postmenopausal    ??? Seizure disorder (HCC)    ??? Stomach disorder      Surgical History:   Procedure Laterality Date   ??? HYSTERECTOMY  1985    patient unsure if BSO was also performed   ??? 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 KNEE REPLACEMENT Right 2018   ??? CHOLECYSTECTOMY     ??? COLONOSCOPY     ??? ELECTROCARDIOGRAM     ??? HX BACK SURGERY     ??? KIDNEY STONE SURGERY     ??? MYOCARDIAL PERFUSION IMG STUDY     ??? UPPER GASTROINTESTINAL ENDOSCOPY       Family History   Problem Relation Age of Onset   ??? Stroke Mother    ??? Cancer-Lung Sister 82   ??? Cancer Sister 8        bone     Social History     Socioeconomic History

## 2018-12-02 ENCOUNTER — Encounter: Admit: 2018-12-02 | Discharge: 2018-12-02 | Payer: MEDICARE

## 2018-12-04 ENCOUNTER — Encounter: Admit: 2018-12-04 | Discharge: 2018-12-04 | Payer: MEDICARE

## 2018-12-05 ENCOUNTER — Encounter: Admit: 2018-12-05 | Discharge: 2018-12-05 | Payer: MEDICARE

## 2018-12-05 ENCOUNTER — Encounter: Admit: 2018-12-05 | Discharge: 2018-12-06 | Payer: MEDICARE

## 2018-12-05 DIAGNOSIS — Z170 Estrogen receptor positive status [ER+]: ICD-10-CM

## 2018-12-05 NOTE — Progress Notes
Notified by medical oncology team that patient intends to seek care at [facility] and does not intend to receive any care at [facility]. Will remain available if pt has any questions or changes her mind about receiving surgical care at [facility].    Ignatius Specking, MD  12/05/2018 12:48 PM

## 2018-12-23 IMAGING — MR Head^Brain
10 series · 45 of 48 positions shown · IV contrast (with contrast)
Comparison: none

[Series 2: T1 · sagittal · 5.0mm · 0.45mm/px · 3 of 20 slices shown (1 of 2)]
[im 1/20]
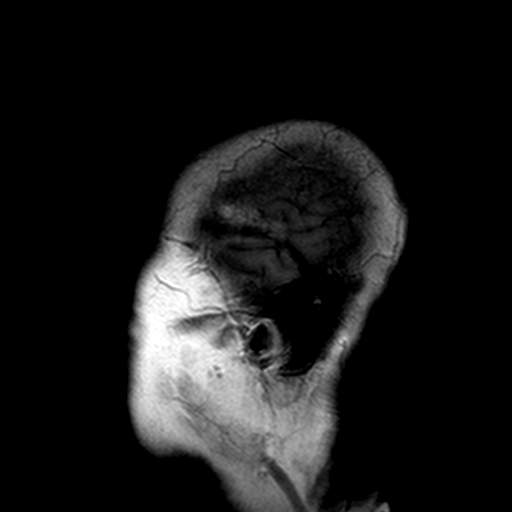
[im 10/20]
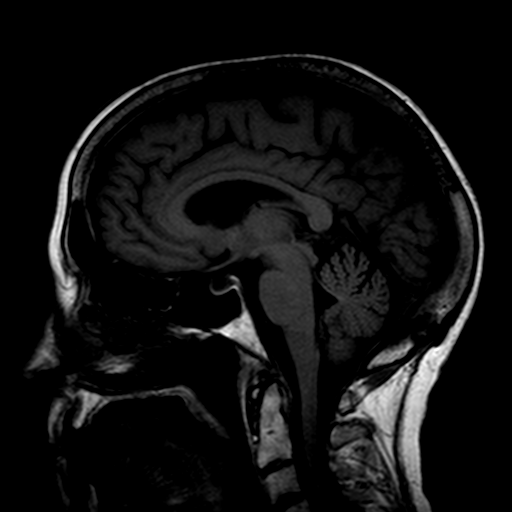
[im 20/20]
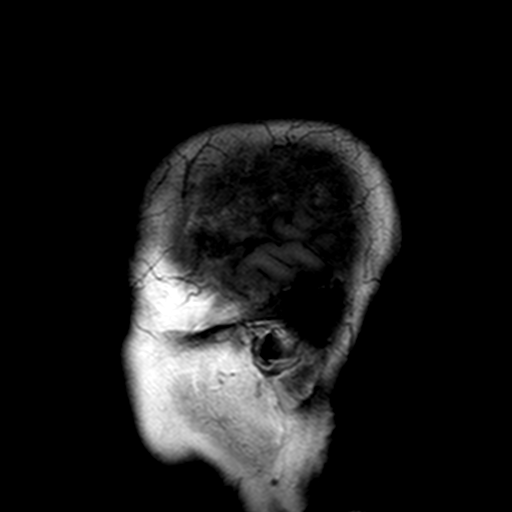

[Series 3: DWI · axial · 5.0mm · 1.80mm/px · z∈[-71,+59]mm · 11 of 63 slices shown (1 of 2)]
[im 1/63]
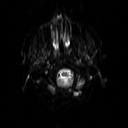
[im 7/63]
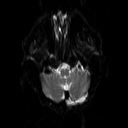
[im 13/63]
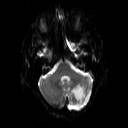
[im 19/63]
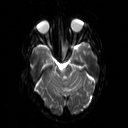
[im 25/63]
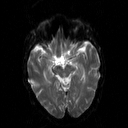
[im 32/63]
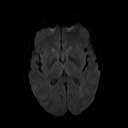
[im 38/63]
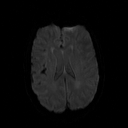
[im 44/63]
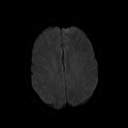
[im 50/63]
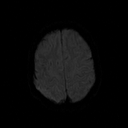
[im 56/63]
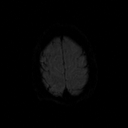
[im 63/63]
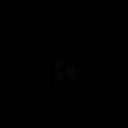

[Series 4: DWI · axial · 5.0mm · 1.80mm/px · z∈[-71,+59]mm · 4 of 20 slices shown (2 of 2)]
[im 1/20]
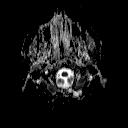
[im 7/20]
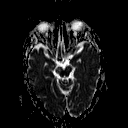
[im 13/20]
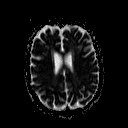
[im 20/20]
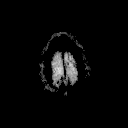

[Series 5: FLAIR · axial · 5.0mm · 0.45mm/px · z∈[-70,+79]mm · 4 of 24 slices shown]
[im 1/24]
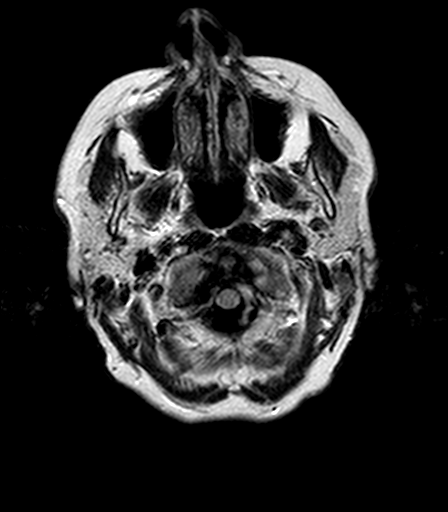
[im 8/24]
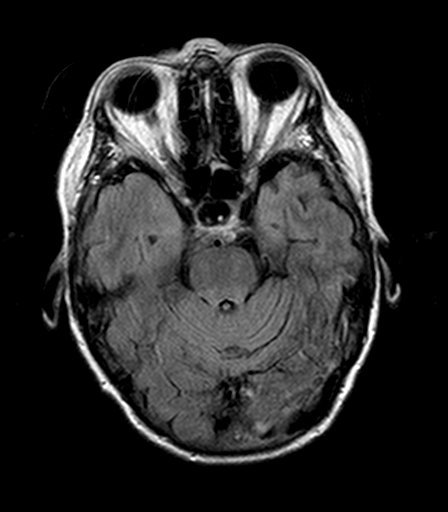
[im 16/24]
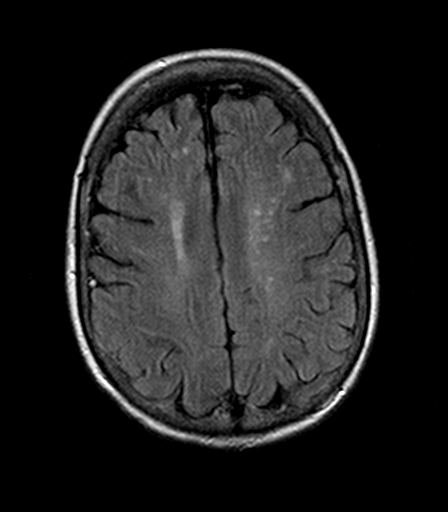
[im 24/24]
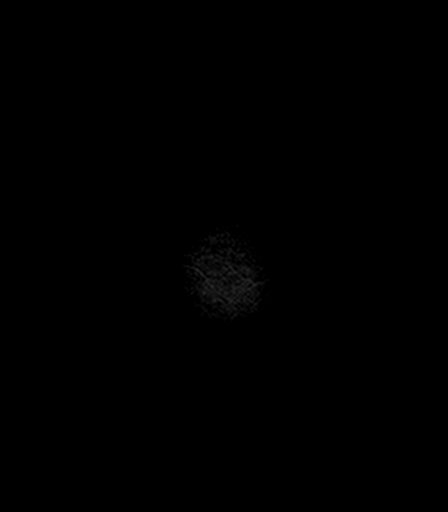

[Series 6: T2 · axial · 5.0mm · 0.72mm/px · z∈[-70,+79]mm · 4 of 23 slices shown (1 of 2)]
[im 1/23]
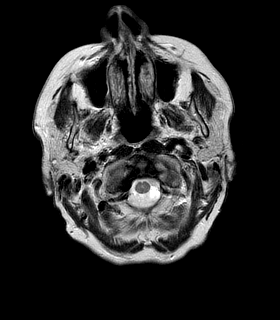
[im 8/23]
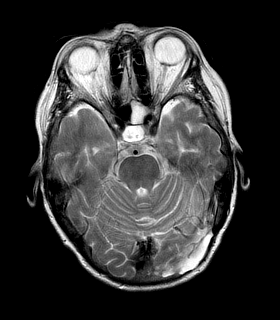
[im 15/23]
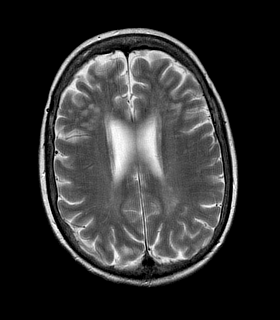
[im 23/23]
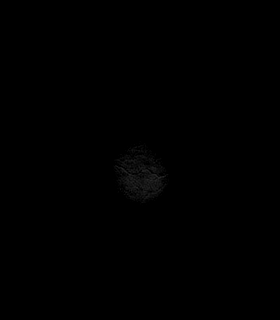

[Series 7: axial blood · axial · 5.0mm · 0.45mm/px · 1 of 21 slices shown]
[im 1/21]
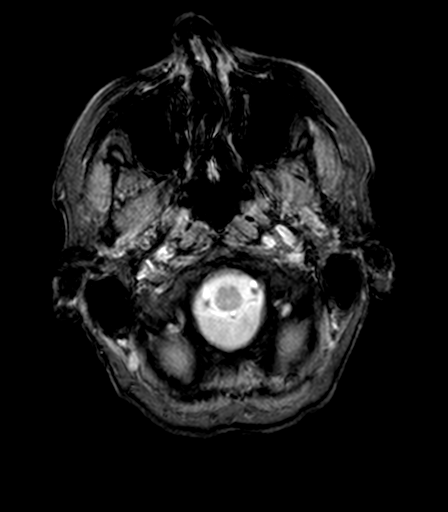

[Series 8: T1 · axial · 5.0mm · 0.45mm/px · z∈[-70,+79]mm · 4 of 24 slices shown (2 of 2)]
[im 1/24]
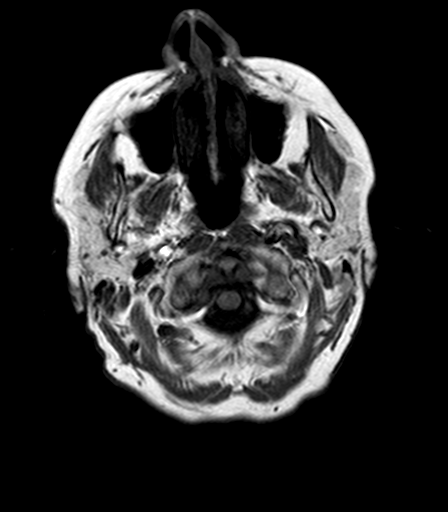
[im 8/24]
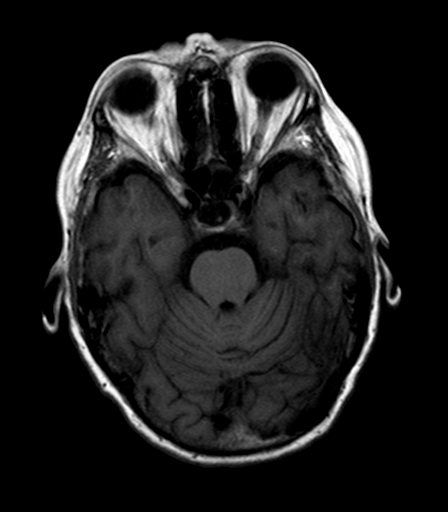
[im 16/24]
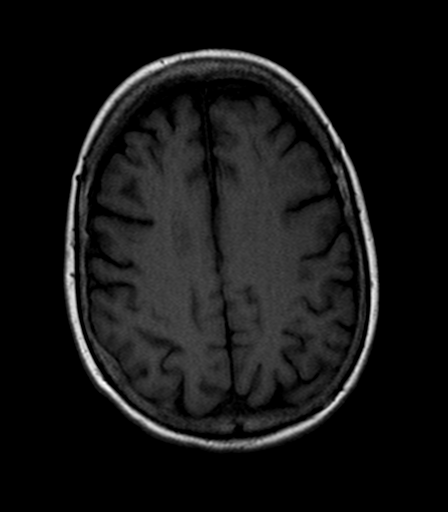
[im 24/24]
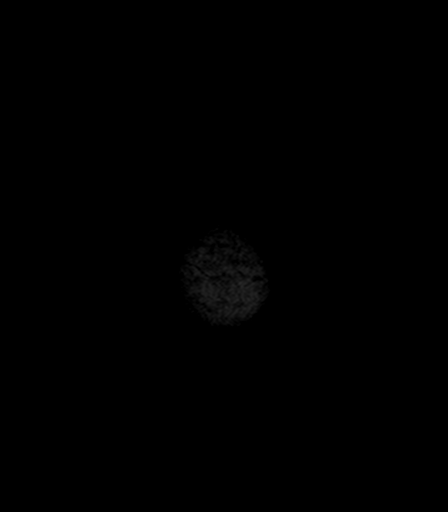

[Series 9: T2 · coronal · 5.0mm · 0.69mm/px · 5 of 27 slices shown (2 of 2)]
[im 1/27]
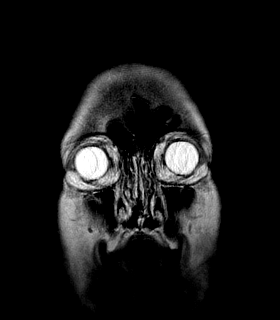
[im 7/27]
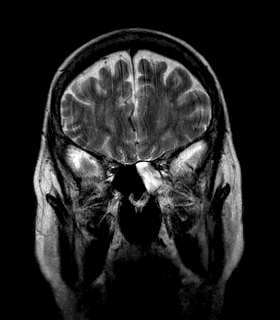
[im 14/27]
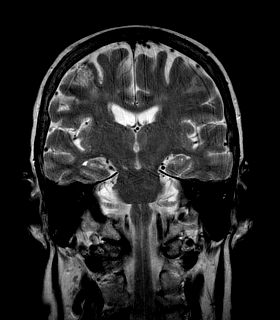
[im 20/27]
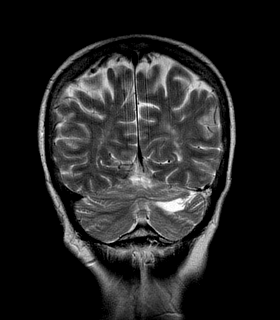
[im 27/27]
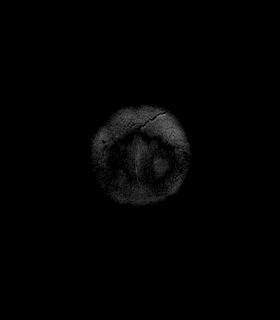

[Series 10: T1 fat-sat post-contrast · axial · 5.0mm · 0.90mm/px · z∈[-70,+79]mm · 4 of 24 slices shown (1 of 2)]
[im 1/24]
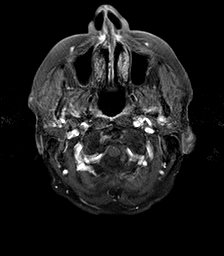
[im 8/24]
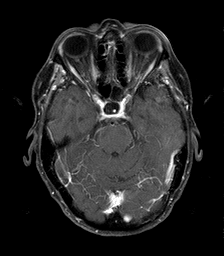
[im 16/24]
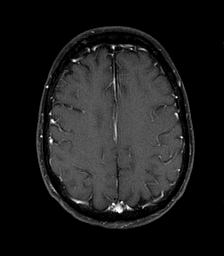
[im 24/24]
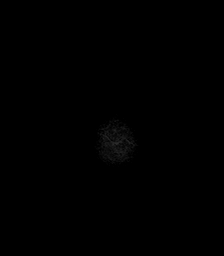

[Series 11: T1 fat-sat post-contrast · coronal · 5.0mm · 0.90mm/px · 5 of 28 slices shown (2 of 2)]
[im 1/28]
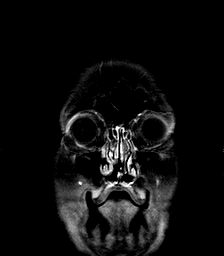
[im 7/28]
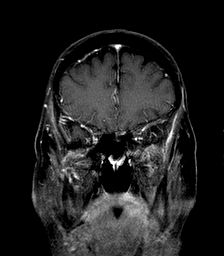
[im 14/28]
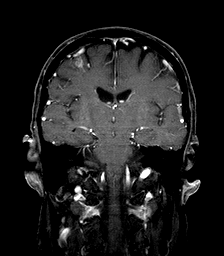
[im 21/28]
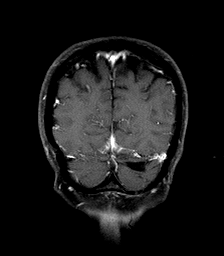
[im 28/28]
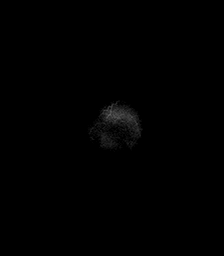

[45 of 48 positions shown; findings below may reference images not displayed]

MRI REPORT

DIAGNOSTIC STUDIES

EXAM

MAGNETIC RESONANCE IMAGING, BRAIN (INCLUDING BRAIN STEM) WITHOUT CONTRAST MATERIAL, FOLLOWED BY
CONTRAST MATERIAL(S) AND FURTHER SEQUENCES, CPT 92776

INDICATION

Lesion posterior fossa
Sinus infection, frontal headaches. History of abnormal CT
Patient received 18cc omniscan.
BG

TECHNIQUE

Multiplanar, multi sequence MR images of the brain were obtained before and after the
administration of intravenous gadolinium.

COMPARISONS

CT sinuses without contrast July 23, 2017

FINDINGS

On midline sagittal images there are normal flow voids in the sagittal sinuses. The corpus
callosum is normal in signal and contour. The pituitary gland demonstrates moderate effacement
likely representing sequela of intracranial hypertension. The brainstem is unremarkable. There is
no cerebellar tonsillar ectopia. There is demonstration of cystic change of the left posterior
occipital bone likely representing focal herniation of CSF and bony remodeling with a dural defect
and partial herniation of brain tissue consistent with a contained intra calvarial encephalo
meningocele with associated encephalomalacia of the left cerebellar hemisphere. Additionally along
the anterior medial left middle cranial fossa there is demonstration of a focal dehiscence of the
bony skull base and an additional meningoencephalocele best appreciated on series 9, image 8. There
is a corresponding finding on comparison study series 3, image 29 which better demonstrates the
bony defect. The craniocervical junction is intact. On diffusion weighted sequences, there is no
evidence of acute or subacute infarct. On blood sensitive sequences, there is no evidence of acute
or chronic hemorrhage. There is moderate chronic small vessel disease change of the subcortical and
periventricular white matter. The brain parenchyma is otherwise normal in signal intensity. There
is no abnormal contrast enhancement. There is no midline shift or mass effect. The orbits and their
contents are unremarkable. The paranasal sinuses demonstrate mucosal thickening. Mastoid air cells
are clear.

IMPRESSION

1. Demonstration of left posterior fossa intra calvarial and left middle cranial fossa
encephalomeningoceles with extension of middle cranial fossa encephalomeningocele into the sphenoid
sinus. If there remains persistent clinical concern further evaluation with skullbase MRI may be
useful for slightly improved characterization however these lesions are well defined on current
exam and CT comparison. Consider referral to neurosurgery for definitive management.

2. Otherwise, no acute intracranial abnormality with mild chronic small-vessel disease changes of
the subcortical and periventricular white matter.

## 2019-05-16 ENCOUNTER — Ambulatory Visit: Admit: 2019-05-16 | Discharge: 2019-05-17

## 2019-05-16 ENCOUNTER — Encounter: Admit: 2019-05-16 | Discharge: 2019-05-16

## 2019-05-16 DIAGNOSIS — R55 Syncope and collapse: Secondary | ICD-10-CM

## 2019-05-16 DIAGNOSIS — Z853 Personal history of malignant neoplasm of breast: Secondary | ICD-10-CM

## 2019-05-16 DIAGNOSIS — G459 Transient cerebral ischemic attack, unspecified: Secondary | ICD-10-CM

## 2020-07-08 ENCOUNTER — Encounter: Admit: 2020-07-08 | Discharge: 2020-07-08 | Payer: MEDICARE

## 2020-07-08 DIAGNOSIS — E781 Pure hyperglyceridemia: Secondary | ICD-10-CM

## 2020-07-08 DIAGNOSIS — R32 Unspecified urinary incontinence: Secondary | ICD-10-CM

## 2020-07-08 DIAGNOSIS — Z23 Encounter for immunization: Secondary | ICD-10-CM

## 2020-07-08 DIAGNOSIS — R0789 Other chest pain: Secondary | ICD-10-CM

## 2020-07-08 DIAGNOSIS — Z780 Asymptomatic menopausal state: Secondary | ICD-10-CM

## 2020-07-08 DIAGNOSIS — E663 Overweight: Secondary | ICD-10-CM

## 2020-07-08 DIAGNOSIS — N200 Calculus of kidney: Secondary | ICD-10-CM

## 2020-07-08 DIAGNOSIS — R0989 Other specified symptoms and signs involving the circulatory and respiratory systems: Secondary | ICD-10-CM

## 2020-07-08 DIAGNOSIS — M1990 Unspecified osteoarthritis, unspecified site: Secondary | ICD-10-CM

## 2020-07-08 DIAGNOSIS — R519 Generalized headaches: Secondary | ICD-10-CM

## 2020-07-08 DIAGNOSIS — M797 Fibromyalgia: Secondary | ICD-10-CM

## 2020-07-08 DIAGNOSIS — C50411 Malignant neoplasm of upper-outer quadrant of right female breast: Secondary | ICD-10-CM

## 2020-07-08 DIAGNOSIS — I10 Essential (primary) hypertension: Secondary | ICD-10-CM

## 2020-07-08 DIAGNOSIS — F32A Depression: Secondary | ICD-10-CM

## 2020-07-08 DIAGNOSIS — G40909 Epilepsy, unspecified, not intractable, without status epilepticus: Secondary | ICD-10-CM

## 2020-07-08 DIAGNOSIS — R0600 Dyspnea, unspecified: Secondary | ICD-10-CM

## 2020-07-08 DIAGNOSIS — E785 Hyperlipidemia, unspecified: Secondary | ICD-10-CM

## 2020-07-08 DIAGNOSIS — K529 Noninfective gastroenteritis and colitis, unspecified: Secondary | ICD-10-CM

## 2020-07-08 DIAGNOSIS — C50919 Malignant neoplasm of unspecified site of unspecified female breast: Secondary | ICD-10-CM

## 2020-07-08 DIAGNOSIS — K319 Disease of stomach and duodenum, unspecified: Secondary | ICD-10-CM

## 2020-07-08 DIAGNOSIS — H269 Unspecified cataract: Secondary | ICD-10-CM

## 2020-07-08 DIAGNOSIS — R0602 Shortness of breath: Secondary | ICD-10-CM

## 2020-07-08 DIAGNOSIS — Z8673 Personal history of transient ischemic attack (TIA), and cerebral infarction without residual deficits: Secondary | ICD-10-CM

## 2020-07-08 DIAGNOSIS — R5382 Chronic fatigue, unspecified: Secondary | ICD-10-CM

## 2020-07-08 DIAGNOSIS — Z87891 Personal history of nicotine dependence: Secondary | ICD-10-CM

## 2020-07-08 NOTE — Progress Notes
Date of Service: 07/08/2020    Michelle Woods Woods is a 84 y.o. female.       HPI     Michelle Woods Woods is a 84 year old white female, she was last seen by me in the office in August 2019.  She does have a history of hypertension, chronic fatigue, right carotid bruit, history of tobacco use.    Patient was diagnosed with right breast cancer, she did undergo mastectomy, apparently she did experience a recurrence in July 2021 and she is currently undergoing daily chemotherapy.    Patient presents today to our office with symptoms of significant shortness of breath that are triggered only by walking a few feet.  Her daughter that accompanies her states that the shortness of breath ensued few days ago and is getting progressively worse.  Patient also reports chest pain, she actually describes this as a hurt that is in the lower chest/higher epigastric area, it occurs with or without physical activity.    Patient was evaluated with a chest CT on 07/07/2020 the report says there were no acute cardiopulmonary findings, hepatic cystic lesions or renal cystic lesions were identified and also coronary artery calcifications were seen.    Patient's oxygen saturation today in our office was 96%.  The blood pressure and the heart rate were within normal limits.  Patient was not in any acute distress, she did not have any perioral cyanosis and she appeared to be stable at rest.       Vitals:    07/08/20 1306   BP: 104/72   BP Source: Arm, Left Upper   Patient Position: Sitting   Pulse: 90   SpO2: 96%   Weight: 71.7 kg (158 lb)   Height: 1.676 m (5' 6)   PainSc: Seven     Body mass index is 25.5 kg/m?Marland Kitchen     Past Medical History  Patient Active Problem List    Diagnosis Date Noted   ? Malignant neoplasm of upper-outer quadrant of right breast in female, estrogen receptor positive (HCC) 11/26/2018     Diagnosis:  Right multifocal breast cancer with involved axillary lymph node, dx 10/2018  - Grade 2, triple negative, IDC (ER0%, PR0%, Her2 2+, FISH 1.2) at 10:00  - Grade 2, IDC at 12:00  Procedure: pending    History: Michelle Woods Woods is a female who presented to the [facility] on 11/28/2018 at age 7 for evaluation of her right breast cancer. She first noticed a mass in her right breast in January 2020. Bilateral diagnostic mammograms showed a 2.9 cm spiculated mass and ultrasound showed a 3.1 cm spiculated mass at 10:00, 6 cm FTN which correlated with the finding on mammogram. In addition, there was a hypoechoic spiculated/angulated mass in the upper breast at 12:00, 11 cm FTN that measured 2.1 cm. There was a right axillary lymph node which shows a fatty hilum but the cortex was focally thickened to 6 mm which was also suspicious. Right breast biopsies 11/08/2018 showed a grade 2, IDC at 10:00, grade 2, IDC at 12:00, and the lymph node biopsy was positive for ductal carcinoma with prominent lymphoid proliferation, consistent with metastatic lymph node.     Breast Imaging:   - Bilateral diagnostic mammograms 11/04/2018 [facility]) showed scattered fibroglandular density. There was a 2.9 cm, spiculated mass in the right breast at 10:00, 7 cm FTN and 8 cm deep to the nipple. Extensive spiculated was noted in this region.   - Right breast ultrasound 11/04/2018 [facility]) showed a 3.1 cm spiculated mass at 10:00, 6 cm FTN which correlated with the finding on mammogram. In addition, there was a hypoechoic spiculated/angulated mass in the upper breast at 12:00, 11 cm FTN that measured 2.1 cm. There was a right axillary lymph node which shows a fatty hilum but the cortex was focally thickened to 6 mm which was also suspicious. The lymph node measured 2.1 cm BIRADS 5.   - Right breast ultrasound 11/28/2018 ([place]): At 10:00, 6 cm from the nipple, the known neoplasm is seen with a clip, measuring 3.0 x 2.8 x 1.9 cm. The second biopsy site, labeled 12:00, 11 cm from the nipple, is most likely a Rotter's node, measuring 2.5 x 1.3 x 1 x 2.0 cm with a clip within it. Axillary nodes 1 and 2 are normal. Axillary node # 3 likely represents the one that underwent previous biopsy measuring 1.4 x 0.7 cm, showing obvious eccentric cortical thickening, and indistinct capsule. Eccentric cortical flow is seen suggesting capsular invasion. THERE WAS NO CLIP PLACED, PER OUTSIDE REPORT. A CLIP COULD BE PLACED IF DESIRED. Lymph nodes #4 and #5 are normal. 1 additional level 2, and 2 level 3 nodes are visualized and are normal. No supraclavicular nodes are appreciated. Impression: 1. Known mammographic neoplasm, 12:00, 11 cm from nipple with clip. 2.Status post biopsy of Rotter's node with clip. 3.Status post biopsy of level 1 lymph node without clip. Clip could be placed if requested. IMPRESSION: ASSESSMENT: BIRAD 6 RECOMMENDATION: Follow-up with your physician.    Past Medical History: Seizures (neurologist is Dr. Garth Bigness in [place], [place]), HTN, fibromyalgia, seasonal allergies, insomnia, colitis, hx of renal stones, hx of smoking 1 PPD x 50 years (quit in 2010)  Family History: Sister with lung cancer at 37 and sister with bone scan at 60. Negative for breast, ovarian, and prostate cancer.    Reproductive health:  Age at Menarche: 38   Age at First Live Birth: 71   Age at Menopause: unsure, patient had a hysterectomy at age 72 and is unsure if her ovaries remain intact; no hx of HRT   Gravida: 4  Para: 4  Did patient breastfeed?: Did not breast feed  Medical Oncologist: Dr. Raleigh Callas  Physical Exam on Presentation: Right breast - 3 cm mass at 10:00, 6 cm FTN. No skin changes. Left breast- no palpable masses or skin changes. No axillary, infraclavicular, or supraclavicular adenopathy.  Referred by: Dr. Ozzie Hoyle        ? Right carotid bruit 09/13/2017   ? 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 SOB (shortness of breath) 07/31/2017   ? History of tobacco abuse 07/31/2017   ? Chronic fatigue 08/31/2015   ? UTI (lower urinary tract infection) 07/09/2013     - constipation, infrequent estrace use  - did not complete PFRT due to distance  - most bothered by nighttime frequency and urine malodor     ? Atypical chest pain 02/08/2012   ? Fibromyalgia syndrome 02/08/2012   ? Essential hypertension 02/08/2012   ? Pure hyperglyceridemia 02/08/2012   ? History of TIAs 02/07/2012     1.  No residual           Review of Systems   Constitutional: Positive for malaise/fatigue.   HENT: Negative.    Eyes: Negative.    Cardiovascular: Positive for chest pain, dyspnea on exertion, irregular heartbeat and palpitations.   Respiratory: Positive for cough and shortness of breath.    Endocrine: Negative.  Hematologic/Lymphatic: Negative.    Skin: Negative.    Musculoskeletal: Positive for back pain, joint pain, muscle cramps, muscle weakness, myalgias and neck pain.   Gastrointestinal: Positive for abdominal pain and change in bowel habit.   Genitourinary: Negative.    Neurological: Positive for excessive daytime sleepiness, dizziness, headaches, light-headedness, numbness, paresthesias and weakness.   Psychiatric/Behavioral: Positive for depression.   Allergic/Immunologic: Negative.        Physical Exam  General Appearance: normal in appearance  Skin: warm, moist, no ulcers or xanthomas  Eyes: conjunctivae and lids normal, pupils are equal and round  Lips & Oral Mucosa: no pallor or cyanosis  Neck Veins: neck veins are flat, neck veins are not distended  Chest Inspection: chest is normal in appearance  Respiratory Effort: breathing comfortably, no respiratory distress  Auscultation/Percussion: lungs clear to auscultation, no rales or rhonchi, no wheezing  Cardiac Rhythm: regular rhythm and normal rate  Cardiac Auscultation: S1, S2 normal, no rub, no gallop  Murmurs: no murmur  Carotid Arteries: normal carotid upstroke bilaterally, no bruit  Lower Extremity Edema: no lower extremity edema  Abdominal Exam: soft, non-tender, no masses, bowel sounds normal  Liver & Spleen: no organomegaly  Language and Memory: patient responsive and seems to comprehend information  Neurologic Exam: neurological assessment grossly intact      Cardiovascular Studies  Twelve-lead EKG demonstrated normal sinus rhythm, no ST segment T wave changes    Cardiovascular Health Factors  Vitals BP Readings from Last 3 Encounters:   07/08/20 104/72   05/16/19 (!) 144/78   11/28/18 (!) 202/108     Wt Readings from Last 3 Encounters:   07/08/20 71.7 kg (158 lb)   05/16/19 69.9 kg (154 lb)   11/28/18 71.2 kg (157 lb)     BMI Readings from Last 3 Encounters:   07/08/20 25.50 kg/m?   05/16/19 25.63 kg/m?   11/28/18 26.74 kg/m?      Smoking Social History     Tobacco Use   Smoking Status Former Smoker   ? Packs/day: 1.00   ? Years: 50.00   ? Pack years: 50.00   ? Types: Cigarettes   ? Quit date: 2010   ? Years since quitting: 11.7   Smokeless Tobacco Never Used      Lipid Profile Cholesterol   Date Value Ref Range Status   11/07/2017 174       HDL   Date Value Ref Range Status   11/07/2017 28 (L) 35 - 60      LDL   Date Value Ref Range Status   11/07/2017 57       Triglycerides   Date Value Ref Range Status   11/07/2017 504 (H) 30 - 200       Blood Sugar Hemoglobin A1C   Date Value Ref Range Status   02/15/2017 5.1  Final     Glucose   Date Value Ref Range Status   11/28/2018 82 70 - 100 MG/DL Final   16/06/9603 540 (H) 83 - 110 Final   02/15/2017 88  Final          Problems Addressed Today  Encounter Diagnoses   Name Primary?   ? Essential hypertension Yes   ? Pure hyperglyceridemia    ? Chronic fatigue    ? Fibromyalgia syndrome    ? History of TIAs    ? History of tobacco abuse    ? Right carotid bruit    ? SOB (shortness of breath)    ?  Malignant neoplasm of upper-outer quadrant of right breast in female, estrogen receptor positive (HCC)    ? COVID-19 vaccine administered    ? Atypical chest pain        Assessment and Plan     In summary: This is an 84 year old white female that presents with the following cardiovascular issues    1.  Worsening dyspnea for the past few days  ? This is triggered even by walking few feet  ? Objective findings today in the office were compatible with stable clinical status  ? The pulmonary exam did not demonstrate any crepitant rales, no rhonchi and no wheezing, the lung fields were clear to auscultation  ? The oxygen saturation was 96%  ? Her symptoms however could be due to pulmonary thromboembolic disease  ? This also could represent an atypical form of angina (patient was noted to have coronary artery calcifications on the chest CT dated 07/07/2020  ? ?  Could this represent radiation pneumonitis given the fact that patient undergoes daily radiation therapy    2.  Right breast cancer  ? Patient did undergo previous mastectomy  ? Currently patient undergoes daily radiation therapy, she states that she is stage IV breast cancer    3.  Primary hypertension  Currently patient is treated with a beta-blocker, her blood pressure is under good control.    Plan:    1.  Further evaluation with a 2D echo Doppler study?this will be scheduled by our office hopefully for 07/09/2020 in a.m.  ? The purpose of this study is to evaluate for a pericardial effusion in the setting of stage IV breast cancer and significant shortness of breath  2.  Patient today will go to [facility] for radiation therapy, that is scheduled at 3 PM and while there I suggested the following:  ? Chest x-ray PA and lateral  ? D-dimer  ? Consider CT pulmonary angiogram at the local hospital to rule pulmonary thromboembolic disease  3.  In review of her symptoms is also coronary artery calcifications visualized on the chest CT performed on 07/07/2020 we can further evaluate this patient with a perfusion imaging study  4.  We will be in touch with the patient over the phone regarding the results of her tests.         Current Medications (including today's revisions)  ? ASCORBATE CALCIUM (VITAMIN C PO) Take 1,000 mg by mouth daily.   ? CALCIUM PO Take 600 mg by mouth daily.   ? cetirizine (ZYRTEC) 10 mg tablet Take 10 mg by mouth every morning.   ? citalopram (CELEXA) 20 mg tablet Take 20 mg by mouth daily.     ? dicyclomine (BENTYL) 10 mg capsule Take 10 mg by mouth three times daily.   ? docusate (COLACE) 100 mg capsule Take 100 mg by mouth as Needed for Constipation.   ? esomeprazole DR(+) (NEXIUM) 40 mg capsule Take 40 mg by mouth daily.   ? fesoterodine ER (TOVIAZ) 8 mg tablet Take 8 mg by mouth daily.   ? gabapentin (NEURONTIN) 300 mg capsule Take 300 mg by mouth three times daily.   ? GLUCOSAMINE/CHONDROITIN SULF A (GLUCOSAMINE-CHONDROITIN PO) Take  by mouth daily.     ? HYDROcodone/acetaminophen(+) (NORCO) 10/325 mg tablet Take 1 Tab by mouth every 6 hours as needed for Pain   ? levETIRAcetam (KEPPRA) 500 mg tablet Take 500 mg by mouth daily.   ? lisinopriL (ZESTRIL) 20 mg tablet Take 20 mg by mouth daily.   ?  metoprolol XL (TOPROL XL) 50 mg extended release tablet Take 50 mg by mouth daily.   ? mometasone furoate (NASONEX NA) Apply 1 spray into nose as directed daily as needed.   ? MULTIVIT &MINERALS/FERROUS FUM (MULTI VITAMIN PO) Take  by mouth daily.     ? traZODone (DESYREL) 150 mg tablet Take 150 mg by mouth at bedtime daily.     ? zonisamide (ZONEGRAN) 100 mg capsule Take 200 mg by mouth at bedtime daily.

## 2020-07-09 ENCOUNTER — Ambulatory Visit: Admit: 2020-07-09 | Discharge: 2020-07-09 | Payer: MEDICARE

## 2020-07-09 ENCOUNTER — Encounter: Admit: 2020-07-09 | Discharge: 2020-07-09 | Payer: MEDICARE

## 2020-07-09 DIAGNOSIS — I10 Essential (primary) hypertension: Secondary | ICD-10-CM

## 2020-07-09 DIAGNOSIS — E781 Pure hyperglyceridemia: Secondary | ICD-10-CM

## 2020-07-09 DIAGNOSIS — R0789 Other chest pain: Secondary | ICD-10-CM

## 2020-07-09 DIAGNOSIS — R5382 Chronic fatigue, unspecified: Secondary | ICD-10-CM

## 2020-07-09 DIAGNOSIS — R0602 Shortness of breath: Secondary | ICD-10-CM

## 2020-07-09 DIAGNOSIS — Z87891 Personal history of nicotine dependence: Secondary | ICD-10-CM

## 2020-07-09 DIAGNOSIS — M797 Fibromyalgia: Secondary | ICD-10-CM

## 2020-07-09 DIAGNOSIS — Z8673 Personal history of transient ischemic attack (TIA), and cerebral infarction without residual deficits: Secondary | ICD-10-CM

## 2020-07-09 DIAGNOSIS — R0989 Other specified symptoms and signs involving the circulatory and respiratory systems: Secondary | ICD-10-CM

## 2020-07-09 LAB — D-DIMER: Lab: 903 — ABNORMAL HIGH (ref 0–810)

## 2020-07-13 ENCOUNTER — Encounter: Admit: 2020-07-13 | Discharge: 2020-07-13 | Payer: MEDICARE

## 2020-07-13 NOTE — Telephone Encounter
Results and recommendations called to patient lmom requested call back if questions

## 2020-07-13 NOTE — Telephone Encounter
-----   Message from Dorris Fetch, MD sent at 07/13/2020  2:05 PM CDT -----  Please call the patient let her know that it was no fluid around the heart in the echocardiogram the teachers did.I can see her back in 1 year from the last office visit.Thank you  ----- Message -----  From: Jana Half, MD  Sent: 07/09/2020   2:26 PM CDT  To: Dorris Fetch, MD

## 2020-07-15 ENCOUNTER — Encounter: Admit: 2020-07-15 | Discharge: 2020-07-15 | Payer: MEDICARE
